# Patient Record
Sex: Female | Born: 1960 | Race: White | Hispanic: No | Marital: Married | Smoking: Former smoker
Health system: Southern US, Community
[De-identification: ages and names within clinical notes are randomized; demographics above are authoritative.]

## PROBLEM LIST (undated history)

## (undated) DIAGNOSIS — H539 Unspecified visual disturbance: Secondary | ICD-10-CM

## (undated) DIAGNOSIS — K862 Cyst of pancreas: Secondary | ICD-10-CM

## (undated) DIAGNOSIS — H04123 Dry eye syndrome of bilateral lacrimal glands: Secondary | ICD-10-CM

## (undated) DIAGNOSIS — G43909 Migraine, unspecified, not intractable, without status migrainosus: Secondary | ICD-10-CM

## (undated) DIAGNOSIS — K760 Fatty (change of) liver, not elsewhere classified: Secondary | ICD-10-CM

## (undated) DIAGNOSIS — R011 Cardiac murmur, unspecified: Secondary | ICD-10-CM

## (undated) DIAGNOSIS — E7800 Pure hypercholesterolemia, unspecified: Secondary | ICD-10-CM

## (undated) DIAGNOSIS — R112 Nausea with vomiting, unspecified: Secondary | ICD-10-CM

## (undated) DIAGNOSIS — T7840XA Allergy, unspecified, initial encounter: Secondary | ICD-10-CM

## (undated) DIAGNOSIS — I10 Essential (primary) hypertension: Secondary | ICD-10-CM

## (undated) DIAGNOSIS — R202 Paresthesia of skin: Secondary | ICD-10-CM

## (undated) DIAGNOSIS — Z9889 Other specified postprocedural states: Secondary | ICD-10-CM

## (undated) HISTORY — PX: TONSILLECTOMY: SUR1361

## (undated) HISTORY — DX: Cyst of pancreas: K86.2

## (undated) HISTORY — DX: Other specified postprocedural states: Z98.890

## (undated) HISTORY — DX: Fatty (change of) liver, not elsewhere classified: K76.0

## (undated) HISTORY — DX: Essential (primary) hypertension: I10

## (undated) HISTORY — PX: ABDOMINAL HYSTERECTOMY: SUR658

## (undated) HISTORY — DX: Migraine, unspecified, not intractable, without status migrainosus: G43.909

## (undated) HISTORY — DX: Cardiac murmur, unspecified: R01.1

## (undated) HISTORY — DX: Dry eye syndrome of bilateral lacrimal glands: H04.123

## (undated) HISTORY — DX: Allergy, unspecified, initial encounter: T78.40XA

## (undated) HISTORY — DX: Paresthesia of skin: R20.2

## (undated) HISTORY — PX: POLYPECTOMY: SHX149

## (undated) HISTORY — DX: Nausea with vomiting, unspecified: R11.2

## (undated) HISTORY — DX: Pure hypercholesterolemia, unspecified: E78.00

## (undated) HISTORY — DX: Unspecified visual disturbance: H53.9

---

## 1998-06-01 ENCOUNTER — Other Ambulatory Visit: Admission: RE | Admit: 1998-06-01 | Discharge: 1998-06-01 | Payer: Self-pay | Admitting: Obstetrics and Gynecology

## 1999-06-20 ENCOUNTER — Other Ambulatory Visit: Admission: RE | Admit: 1999-06-20 | Discharge: 1999-06-20 | Payer: Self-pay | Admitting: Obstetrics and Gynecology

## 2000-06-25 ENCOUNTER — Other Ambulatory Visit: Admission: RE | Admit: 2000-06-25 | Discharge: 2000-06-25 | Payer: Self-pay | Admitting: Obstetrics and Gynecology

## 2001-06-07 ENCOUNTER — Other Ambulatory Visit: Admission: RE | Admit: 2001-06-07 | Discharge: 2001-06-07 | Payer: Self-pay | Admitting: Obstetrics and Gynecology

## 2001-06-07 ENCOUNTER — Encounter (INDEPENDENT_AMBULATORY_CARE_PROVIDER_SITE_OTHER): Payer: Self-pay

## 2001-06-26 ENCOUNTER — Other Ambulatory Visit: Admission: RE | Admit: 2001-06-26 | Discharge: 2001-06-26 | Payer: Self-pay | Admitting: Obstetrics and Gynecology

## 2002-08-11 ENCOUNTER — Other Ambulatory Visit: Admission: RE | Admit: 2002-08-11 | Discharge: 2002-08-11 | Payer: Self-pay | Admitting: Obstetrics and Gynecology

## 2003-08-14 ENCOUNTER — Other Ambulatory Visit: Admission: RE | Admit: 2003-08-14 | Discharge: 2003-08-14 | Payer: Self-pay | Admitting: Obstetrics and Gynecology

## 2004-09-01 ENCOUNTER — Other Ambulatory Visit: Admission: RE | Admit: 2004-09-01 | Discharge: 2004-09-01 | Payer: Self-pay | Admitting: Obstetrics and Gynecology

## 2005-09-14 ENCOUNTER — Other Ambulatory Visit: Admission: RE | Admit: 2005-09-14 | Discharge: 2005-09-14 | Payer: Self-pay | Admitting: Obstetrics and Gynecology

## 2006-11-19 ENCOUNTER — Other Ambulatory Visit: Admission: RE | Admit: 2006-11-19 | Discharge: 2006-11-19 | Payer: Self-pay | Admitting: Obstetrics and Gynecology

## 2007-12-20 ENCOUNTER — Other Ambulatory Visit: Admission: RE | Admit: 2007-12-20 | Discharge: 2007-12-20 | Payer: Self-pay | Admitting: Family Medicine

## 2009-12-18 HISTORY — PX: ABDOMINAL HYSTERECTOMY: SUR658

## 2011-05-24 ENCOUNTER — Emergency Department (HOSPITAL_COMMUNITY): Payer: PRIVATE HEALTH INSURANCE

## 2011-05-24 ENCOUNTER — Observation Stay (HOSPITAL_COMMUNITY)
Admission: EM | Admit: 2011-05-24 | Discharge: 2011-05-24 | Disposition: A | Discharge status: Home / Self Care | Payer: PRIVATE HEALTH INSURANCE | Attending: Emergency Medicine | Admitting: Emergency Medicine

## 2011-05-24 ENCOUNTER — Observation Stay (HOSPITAL_COMMUNITY): Payer: PRIVATE HEALTH INSURANCE

## 2011-05-24 DIAGNOSIS — R079 Chest pain, unspecified: Principal | ICD-10-CM | POA: Insufficient documentation

## 2011-05-24 DIAGNOSIS — I10 Essential (primary) hypertension: Secondary | ICD-10-CM | POA: Insufficient documentation

## 2011-05-24 LAB — TROPONIN I
Troponin I: <0.3 ng/mL (ref <0.30)
Troponin I: <0.3 ng/mL (ref <0.30)

## 2011-05-24 LAB — CK TOTAL AND CKMB (NOT AT ARMC)
CK, MB: 1.7 ng/mL (ref 0.3–4.0)
Relative Index: INVALID (ref 0.0–2.5)
Total CK: 72 U/L (ref 7–177)

## 2011-05-24 LAB — BASIC METABOLIC PANEL
BUN: 17 mg/dL (ref 6–23)
Chloride: 99 mEq/L (ref 96–112)
Potassium: 3.7 mEq/L (ref 3.5–5.1)

## 2011-05-24 LAB — CBC
Platelets: 281 10*3/uL (ref 150–400)
RBC: 4.02 MIL/uL (ref 3.87–5.11)
RDW: 12 % (ref 11.5–15.5)
WBC: 4.7 10*3/uL (ref 4.0–10.5)

## 2011-05-24 LAB — DIFFERENTIAL
Basophils Absolute: 0 10*3/uL (ref 0.0–0.1)
Basophils Relative: 0 % (ref 0–1)
Eosinophils Absolute: 0.2 10*3/uL (ref 0.0–0.7)
Eosinophils Relative: 4 % (ref 0–5)
Neutrophils Relative %: 55 % (ref 43–77)

## 2011-05-24 MED ORDER — IOHEXOL 350 MG/ML SOLN
50.0000 mL | Freq: Once | INTRAVENOUS | Status: AC | PRN
  Administered 2011-05-24: 50 mL via INTRAVENOUS

## 2016-08-14 HISTORY — PX: COLONOSCOPY: SHX174

## 2016-11-29 ENCOUNTER — Other Ambulatory Visit: Payer: Self-pay | Admitting: Physician Assistant

## 2016-11-29 DIAGNOSIS — R748 Abnormal levels of other serum enzymes: Secondary | ICD-10-CM

## 2016-12-08 ENCOUNTER — Ambulatory Visit
Admission: RE | Admit: 2016-12-08 | Discharge: 2016-12-08 | Disposition: A | Discharge status: Home / Self Care | Payer: PRIVATE HEALTH INSURANCE | Source: Ambulatory Visit | Attending: Physician Assistant | Admitting: Physician Assistant

## 2016-12-08 DIAGNOSIS — R748 Abnormal levels of other serum enzymes: Secondary | ICD-10-CM

## 2016-12-28 ENCOUNTER — Other Ambulatory Visit: Payer: Self-pay | Admitting: Physician Assistant

## 2016-12-28 DIAGNOSIS — R932 Abnormal findings on diagnostic imaging of liver and biliary tract: Secondary | ICD-10-CM

## 2016-12-29 ENCOUNTER — Ambulatory Visit
Admission: RE | Admit: 2016-12-29 | Discharge: 2016-12-29 | Disposition: A | Discharge status: Home / Self Care | Payer: Self-pay | Source: Ambulatory Visit | Attending: Physician Assistant | Admitting: Physician Assistant

## 2016-12-29 DIAGNOSIS — R932 Abnormal findings on diagnostic imaging of liver and biliary tract: Secondary | ICD-10-CM

## 2016-12-29 MED ORDER — GADOBENATE DIMEGLUMINE 529 MG/ML IV SOLN
16.0000 mL | Freq: Once | INTRAVENOUS | Status: AC | PRN
  Administered 2016-12-29: 16 mL via INTRAVENOUS

## 2017-12-05 ENCOUNTER — Other Ambulatory Visit: Payer: Self-pay | Admitting: Physician Assistant

## 2017-12-05 DIAGNOSIS — K863 Pseudocyst of pancreas: Principal | ICD-10-CM

## 2017-12-05 DIAGNOSIS — K862 Cyst of pancreas: Secondary | ICD-10-CM

## 2017-12-31 ENCOUNTER — Ambulatory Visit
Admission: RE | Admit: 2017-12-31 | Discharge: 2017-12-31 | Disposition: A | Discharge status: Home / Self Care | Payer: BLUE CROSS/BLUE SHIELD | Source: Ambulatory Visit | Attending: Physician Assistant | Admitting: Physician Assistant

## 2017-12-31 DIAGNOSIS — K862 Cyst of pancreas: Secondary | ICD-10-CM

## 2017-12-31 DIAGNOSIS — K863 Pseudocyst of pancreas: Principal | ICD-10-CM

## 2017-12-31 MED ORDER — IOPAMIDOL (ISOVUE-300) INJECTION 61%
100.0000 mL | Freq: Once | INTRAVENOUS | Status: AC | PRN
  Administered 2017-12-31: 125 mL via INTRAVENOUS

## 2018-12-27 DIAGNOSIS — G43009 Migraine without aura, not intractable, without status migrainosus: Secondary | ICD-10-CM | POA: Diagnosis not present

## 2018-12-27 DIAGNOSIS — E7800 Pure hypercholesterolemia, unspecified: Secondary | ICD-10-CM | POA: Diagnosis not present

## 2018-12-27 DIAGNOSIS — I10 Essential (primary) hypertension: Secondary | ICD-10-CM | POA: Diagnosis not present

## 2019-02-04 ENCOUNTER — Other Ambulatory Visit: Payer: Self-pay | Admitting: Physician Assistant

## 2019-02-04 ENCOUNTER — Other Ambulatory Visit (HOSPITAL_COMMUNITY): Payer: Self-pay | Admitting: Physician Assistant

## 2019-02-04 DIAGNOSIS — I358 Other nonrheumatic aortic valve disorders: Secondary | ICD-10-CM

## 2019-02-11 ENCOUNTER — Encounter (INDEPENDENT_AMBULATORY_CARE_PROVIDER_SITE_OTHER): Payer: Self-pay

## 2019-02-11 ENCOUNTER — Ambulatory Visit (HOSPITAL_COMMUNITY): Payer: Commercial Managed Care - PPO | Attending: Cardiovascular Disease

## 2019-02-11 DIAGNOSIS — I358 Other nonrheumatic aortic valve disorders: Secondary | ICD-10-CM | POA: Insufficient documentation

## 2019-02-24 ENCOUNTER — Encounter: Payer: Self-pay | Admitting: *Deleted

## 2019-02-25 ENCOUNTER — Encounter: Payer: Self-pay | Admitting: Neurology

## 2019-02-25 ENCOUNTER — Ambulatory Visit: Payer: Commercial Managed Care - PPO | Admitting: Neurology

## 2019-02-25 ENCOUNTER — Other Ambulatory Visit: Payer: Self-pay

## 2019-02-25 VITALS — BP 152/84 | HR 88 | Resp 16 | Ht 68.0 in | Wt 188.0 lb

## 2019-02-25 DIAGNOSIS — B0229 Other postherpetic nervous system involvement: Secondary | ICD-10-CM | POA: Insufficient documentation

## 2019-02-25 MED ORDER — DICLOFENAC SODIUM 1 % TD GEL: 4.0000 g | Freq: Four times a day (QID) | TRANSDERMAL | 6 refills | Status: AC

## 2019-02-25 MED ORDER — GABAPENTIN 300 MG PO CAPS: 600.0000 mg | ORAL_CAPSULE | Freq: Three times a day (TID) | ORAL | 6 refills | Status: DC

## 2019-02-25 MED ORDER — LIDOCAINE-PRILOCAINE 2.5-2.5 % EX CREA: 1.0000 "application " | TOPICAL_CREAM | CUTANEOUS | 3 refills | Status: AC | PRN

## 2019-02-25 NOTE — Progress Notes (Signed)
PATIENT: Kristen Odonnell DOB: October 23, 1961  Chief Complaint  Patient presents with  . Numbness    New room. Here alone for eval of painful numbness, tingling just below left breast. Thought sx. represented shingles, so she was treated with Valtrex. Never had a rash. Sx. have been the same since onset. No better, no worse./fim  . PCP    Cyndi Bender, PA-C     HISTORICAL  Kristen Odonnell is a 58 year old female, seen in request by her primary care PA Cyndi Bender, for evaluation of paresthesia, initial evaluation was on February 25, 2019.  I have reviewed and summarized the referring note from the referring physician.  She had a past medical history of hypertension, hyperlipidemia, migraine headaches, reported a history of chickenpox as a child.   On December 31, 2018, she noticed sensitivity below left breast, numbness, painful to touch, gradually progress, to the point of intermittent radiating, stabbing pain, there was no rash broke out, she was seen by primary care physician, was diagnosed with possible shingles, eventually was treated with Valtrex 4 weeks after symptom onset,  She was also treated with gabapentin 300 mg, initially 3 tablets a day without helping her symptoms, dosage was increased to 600 mg 3 times a day since March 2020, which has been helpful.  REVIEW OF SYSTEMS: Full 14 system review of systems performed and notable only for as above All other review of systems were negative.  ALLERGIES: No Known Allergies  HOME MEDICATIONS: Current Outpatient Medications  Medication Sig Dispense Refill  . amitriptyline (ELAVIL) 25 MG tablet TAKE 1/2 TO 1 TABLET BY MOUTH EVERY DAY AT BEDTIME    . atorvastatin (LIPITOR) 40 MG tablet Take 40 mg by mouth daily.    Marland Kitchen estradiol (VIVELLE-DOT) 0.0375 MG/24HR APPLY 1 PATCH TWICE A WEEK    . gabapentin (NEURONTIN) 300 MG capsule TAKE 1 CAPSULE BY MOUTH 3 TIMES A DAY FOR NERVE PAIN    . lisinopril-hydrochlorothiazide (PRINZIDE,ZESTORETIC)  20-25 MG tablet TAKE 1 TABLET BY MOUTH EVERY DAY FOR BLOOD PRESSURE    . XIIDRA 5 % SOLN INSTILL 1 DROP INTO EACH EYE TWICE A DAY     No current facility-administered medications for this visit.     PAST MEDICAL HISTORY: Past Medical History:  Diagnosis Date  . Fatty liver   . Heart murmur   . Hypercholesteremia   . Hypertension   . Migraine   . Pancreatic cyst   . Paresthesia   . Vision abnormalities     PAST SURGICAL HISTORY: Past Surgical History:  Procedure Laterality Date  . ABDOMINAL HYSTERECTOMY    . TONSILLECTOMY      FAMILY HISTORY: Family History  Problem Relation Age of Onset  . Hypertension Mother   . Hypercholesterolemia Mother   . Hypercholesterolemia Father   . Colon cancer Father   . Breast cancer Sister   . Von Hippel-Lindau syndrome Brother   . Other Maternal Grandmother        MVA, subdural hematoma  . Heart attack Maternal Grandfather   . Dementia Paternal Grandmother   . Lung cancer Paternal Grandfather   . Heart murmur Sister   . Arrhythmia Brother   . Hypothyroidism Brother     SOCIAL HISTORY: Social History   Socioeconomic History  . Marital status: Married    Spouse name: [name]  . Number of children: 1  . Years of education: Not on file  . Highest education level: High school graduate  Occupational History  . Occupation:  clerical work/accounting    Comment: [employer]  Social Needs  . Financial resource strain: Not on file  . Food insecurity:    Worry: Not on file    Inability: Not on file  . Transportation needs:    Medical: Not on file    Non-medical: Not on file  Tobacco Use  . Smoking status: Former Research scientist (life sciences)  . Smokeless tobacco: Never Used  Substance and Sexual Activity  . Alcohol use: Yes    Comment: Egner use  . Drug use: Never  . Sexual activity: Not on file  Lifestyle  . Physical activity:    Days per week: Not on file    Minutes per session: Not on file  . Stress: Not on file  Relationships  . Social  connections:    Talks on phone: Not on file    Gets together: Not on file    Attends religious service: Not on file    Active member of club or organization: Not on file    Attends meetings of clubs or organizations: Not on file    Relationship status: Not on file  . Intimate partner violence:    Fear of current or ex partner: Not on file    Emotionally abused: Not on file    Physically abused: Not on file    Forced sexual activity: Not on file  Other Topics Concern  . Not on file  Social History Narrative   Lives with husband.   Right-handed.   Caffeine use: 1 cup of coffee daily     PHYSICAL EXAM   Vitals:   02/25/19 1115  BP: (!) 152/84  Pulse: 88  Resp: 16  Weight: 188 lb (85.3 kg)  Height: 5\' 8"  (1.727 m)    Not recorded      Body mass index is 28.59 kg/m.  PHYSICAL EXAMNIATION:  Gen: NAD, conversant, well nourised, obese, well groomed                     Cardiovascular: Regular rate rhythm, no peripheral edema, warm, nontender. Eyes: Conjunctivae clear without exudates or hemorrhage Neck: Supple, no carotid bruits. Pulmonary: Clear to auscultation bilaterally   NEUROLOGICAL EXAM:  MENTAL STATUS: Speech:    Speech is normal; fluent and spontaneous with normal comprehension.  Cognition:     Orientation to time, place and person     Normal recent and remote memory     Normal Attention span and concentration     Normal Language, naming, repeating,spontaneous speech     Fund of knowledge   CRANIAL NERVES: CN II: Visual fields are full to confrontation. Fundoscopic exam is normal with sharp discs and no vascular changes. Pupils are round equal and briskly reactive to [place]. CN III, IV, VI: extraocular movement are normal. No ptosis. CN V: Facial sensation is intact to pinprick in all 3 divisions bilaterally. Corneal responses are intact.  CN VII: Face is symmetric with normal eye closure and smile. CN VIII: Hearing is normal to rubbing fingers CN IX, X:  Palate elevates symmetrically. Phonation is normal. CN XI: Head turning and shoulder shrug are intact CN XII: Tongue is midline with normal movements and no atrophy.  MOTOR: There is no pronator drift of out-stretched arms. Muscle bulk and tone are normal. Muscle strength is normal.  REFLEXES: Reflexes are 2+ and symmetric at the biceps, triceps, knees, and ankles. Plantar responses are flexor.  SENSORY: Allodynia of left T7-8 level, mild dry rash to mid  axillary line, no vesicles noted  COORDINATION: Rapid alternating movements and fine finger movements are intact. There is no dysmetria on finger-to-nose and heel-knee-shin.    GAIT/STANCE: Posture is normal. Gait is steady with normal steps, base, arm swing, and turning. Heel and toe walking are normal. Tandem gait is normal.  Romberg is absent.   DIAGNOSTIC DATA (LABS, IMAGING, TESTING) - I reviewed patient records, labs, notes, testing and imaging myself where available.   ASSESSMENT AND PLAN  Zosia Lucchese is a 58 y.o. female   Left thoracic postherpetic neuralgia  History most consistent with left thoracic shingles,  Continue gabapentin as symptoms control,  Also add on lidocaine plus diclofenac gel as needed    Marcial Pacas, M.D. Ph.D.  [facility] [address], [place] [address] Ph: [phone] Fax: [phone]  CC: Cyndi Bender, PA-C

## 2019-03-19 ENCOUNTER — Other Ambulatory Visit: Payer: Self-pay | Admitting: Neurology

## 2019-04-18 ENCOUNTER — Encounter

## 2019-04-18 ENCOUNTER — Ambulatory Visit: Payer: Commercial Managed Care - PPO | Admitting: Diagnostic Neuroimaging

## 2019-08-18 ENCOUNTER — Encounter: Payer: Self-pay | Admitting: Gastroenterology

## 2019-09-16 ENCOUNTER — Other Ambulatory Visit: Payer: Self-pay

## 2019-09-16 ENCOUNTER — Ambulatory Visit (AMBULATORY_SURGERY_CENTER): Payer: Self-pay | Admitting: *Deleted

## 2019-09-16 ENCOUNTER — Encounter: Payer: Self-pay | Admitting: Gastroenterology

## 2019-09-16 VITALS — Temp 97.3°F | Ht 68.0 in | Wt 182.0 lb

## 2019-09-16 DIAGNOSIS — Z860100 Personal history of colon polyps, unspecified: Secondary | ICD-10-CM

## 2019-09-16 DIAGNOSIS — Z800 Family history of malignant neoplasm of digestive organs: Secondary | ICD-10-CM

## 2019-09-16 MED ORDER — SUPREP BOWEL PREP KIT 17.5-3.13-1.6 GM/177ML PO SOLN: 1.0000 | Freq: Once | ORAL | 0 refills | Status: AC

## 2019-09-16 NOTE — Progress Notes (Signed)

## 2019-09-29 ENCOUNTER — Telehealth: Payer: Self-pay

## 2019-09-29 NOTE — Telephone Encounter (Signed)
Patient called back answered "no" to all questions.

## 2019-09-29 NOTE — Telephone Encounter (Signed)
Covid-19 screening questions   Do you now or have you had a fever in the last 14 days?  Do you have any respiratory symptoms of shortness of breath or cough now or in the last 14 days?  Do you have any family members or close contacts with diagnosed or suspected Covid-19 in the past 14 days?  Have you been tested for Covid-19 and found to be positive?       

## 2019-09-30 ENCOUNTER — Other Ambulatory Visit: Payer: Self-pay

## 2019-09-30 ENCOUNTER — Ambulatory Visit (AMBULATORY_SURGERY_CENTER): Payer: Commercial Managed Care - PPO | Admitting: Gastroenterology

## 2019-09-30 ENCOUNTER — Encounter: Payer: Self-pay | Admitting: Gastroenterology

## 2019-09-30 VITALS — BP 121/73 | HR 79 | Temp 98.4°F | Resp 15 | Ht 68.0 in | Wt 182.0 lb

## 2019-09-30 DIAGNOSIS — K635 Polyp of colon: Secondary | ICD-10-CM | POA: Diagnosis not present

## 2019-09-30 DIAGNOSIS — Z800 Family history of malignant neoplasm of digestive organs: Secondary | ICD-10-CM

## 2019-09-30 DIAGNOSIS — Z86010 Personal history of colonic polyps: Secondary | ICD-10-CM | POA: Diagnosis present

## 2019-09-30 DIAGNOSIS — D124 Benign neoplasm of descending colon: Secondary | ICD-10-CM

## 2019-09-30 DIAGNOSIS — D123 Benign neoplasm of transverse colon: Secondary | ICD-10-CM

## 2019-09-30 HISTORY — PX: COLONOSCOPY: SHX5424

## 2019-09-30 MED ORDER — SODIUM CHLORIDE 0.9 % IV SOLN: 500.0000 mL | Freq: Once | INTRAVENOUS | Status: DC

## 2019-09-30 NOTE — Op Note (Signed)
[facility] Patient Name: Kristen Odonnell Procedure Date: 09/30/2019 8:59 AM MRN: [id number] Endoscopist: Jackquline Denmark , MD Age: 58 Referring MD:  Date of Birth: 01-17-1961 Gender: Female Account #: [id number] Procedure:                Colonoscopy Indications:              Screening in patient at increased risk: FH of colon                            cancer (dad at age 75) high risk colon cancer                            surveillance: Personal history of colonic polyps Medicines:                Monitored Anesthesia Care Procedure:                Pre-Anesthesia Assessment:                           - Prior to the procedure, a History and Physical                            was performed, and patient medications and                            allergies were reviewed. The patient's tolerance of                            previous anesthesia was also reviewed. The risks                            and benefits of the procedure and the sedation                            options and risks were discussed with the patient.                            All questions were answered, and informed consent                            was obtained. Prior Anticoagulants: The patient has                            taken no previous anticoagulant or antiplatelet                            agents. ASA Grade Assessment: II - A patient with                            mild systemic disease. After reviewing the risks                            and benefits, the patient was deemed in  satisfactory condition to undergo the procedure.                           After obtaining informed consent, the colonoscope                            was passed under direct vision. Throughout the                            procedure, the patient's blood pressure, pulse, and                            oxygen saturations were monitored continuously. The                            Colonoscope was  introduced through the anus and                            advanced to the the cecum, identified by                            appendiceal orifice and ileocecal valve. Thereafter                            2 cm of TI was intubated. The colonoscopy was                            performed without difficulty. The patient tolerated                            the procedure well. The quality of the bowel                            preparation was good. The terminal ileum, ileocecal                            valve, appendiceal orifice, and rectum were                            photographed. Scope In: 9:04:01 AM Scope Out: 9:20:46 AM Scope Withdrawal Time: 0 hours 10 minutes 41 seconds  Total Procedure Duration: 0 hours 16 minutes 45 seconds  Findings:                 A 10 mm polyp was found in the proximal ascending                            colon. The polyp was sessile. The polyp was removed                            with a hot snare. Resection and retrieval were                            complete.  Two sessile polyps were found in the descending                            colon and mid transverse colon. The polyps were 4                            to 6 mm in size. These polyps were removed with a                            cold snare. Resection and retrieval were complete.                           Non-bleeding internal hemorrhoids were found during                            retroflexion. The hemorrhoids were small.                           The exam was otherwise without abnormality. Complications:            No immediate complications. Estimated Blood Loss:     Estimated blood loss: none. Impression:               - Colonic polyps s/p polypectomy                           - Non-bleeding internal hemorrhoids.                           - The examination was otherwise normal. Recommendation:           - Patient has a contact number available for                             emergencies. The signs and symptoms of potential                            delayed complications were discussed with the                            patient. Return to normal activities tomorrow.                            Written discharge instructions were provided to the                            patient.                           - Resume previous diet.                           - Continue present medications.                           - Await pathology results.                           -  Repeat colonoscopy for surveillance based on                            pathology results.                           - Return to GI clinic PRN. Jackquline Denmark, MD 09/30/2019 9:26:51 AM This report has been signed electronically.

## 2019-09-30 NOTE — Progress Notes (Signed)
Report given to PACU, vss 

## 2019-09-30 NOTE — Patient Instructions (Signed)
Handouts given for hemorrhoids and polyps.  YOU HAD AN ENDOSCOPIC PROCEDURE TODAY AT THE [facility]:   Refer to the procedure report that was given to you for any specific questions about what was found during the examination.  If the procedure report does not answer your questions, please call your gastroenterologist to clarify.  If you requested that your care partner not be given the details of your procedure findings, then the procedure report has been included in a sealed envelope for you to review at your convenience later.  YOU SHOULD EXPECT: Some feelings of bloating in the abdomen. Passage of more gas than usual.  Walking can help get rid of the air that was put into your GI tract during the procedure and reduce the bloating. If you had a lower endoscopy (such as a colonoscopy or flexible sigmoidoscopy) you may notice spotting of blood in your stool or on the toilet paper. If you underwent a bowel prep for your procedure, you may not have a normal bowel movement for a few days.  Please Note:  You might notice some irritation and congestion in your nose or some drainage.  This is from the oxygen used during your procedure.  There is no need for concern and it should clear up in a day or so.  SYMPTOMS TO REPORT IMMEDIATELY:   Following lower endoscopy (colonoscopy or flexible sigmoidoscopy):  Excessive amounts of blood in the stool  Significant tenderness or worsening of abdominal pains  Swelling of the abdomen that is new, acute  Fever of 100F or higher   For urgent or emergent issues, a gastroenterologist can be reached at any hour by calling [phone].   DIET:  We do recommend a small meal at first, but then you may proceed to your regular diet.  Drink plenty of fluids but you should avoid alcoholic beverages for 24 hours.  ACTIVITY:  You should plan to take it easy for the rest of today and you should NOT DRIVE or use heavy machinery until tomorrow (because of  the sedation medicines used during the test).    FOLLOW UP: Our staff will call the number listed on your records 48-72 hours following your procedure to check on you and address any questions or concerns that you may have regarding the information given to you following your procedure. If we do not reach you, we will leave a message.  We will attempt to reach you two times.  During this call, we will ask if you have developed any symptoms of COVID 19. If you develop any symptoms (ie: fever, flu-like symptoms, shortness of breath, cough etc.) before then, please call [phone].  If you test positive for Covid 19 in the 2 weeks post procedure, please call and report this information to us.    If any biopsies were taken you will be contacted by phone or by letter within the next 1-3 weeks.  Please call us at [phone] if you have not heard about the biopsies in 3 weeks.    SIGNATURES/CONFIDENTIALITY: You and/or your care partner have signed paperwork which will be entered into your electronic medical record.  These signatures attest to the fact that that the information above on your After Visit Summary has been reviewed and is understood.  Full responsibility of the confidentiality of this discharge information lies with you and/or your care-partner. 

## 2019-09-30 NOTE — Progress Notes (Signed)
Called to room to assist during endoscopic procedure.  Patient ID and intended procedure confirmed with present staff. Received instructions for my participation in the procedure from the performing physician.  

## 2019-09-30 NOTE — Progress Notes (Signed)
Temperature- Kristen Odonnell  Pt was tested for COVID 19 6 weeks ago- per pt results were negative  Pt's states no medical or surgical changes since previsit or office visit.

## 2019-10-02 ENCOUNTER — Telehealth: Payer: Self-pay

## 2019-10-02 NOTE — Telephone Encounter (Signed)
  Follow up Call-  Call back number 09/30/2019  Post procedure Call Back phone  # [phone]  Permission to leave phone message Yes  Some recent data might be hidden     Patient questions:  Do you have a fever, pain , or abdominal swelling? No. Pain Score  0 *  Have you tolerated food without any problems? Yes.    Have you been able to return to your normal activities? Yes.    Do you have any questions about your discharge instructions: Diet   No. Medications  No. Follow up visit  No.  Do you have questions or concerns about your Care? No.  Actions: * If pain score is 4 or above: 1. No action needed, pain <4.Have you developed a fever since your procedure? no  2.   Have you had an respiratory symptoms (SOB or cough) since your procedure? no  3.   Have you tested positive for COVID 19 since your procedure no  4.   Have you had any family members/close contacts diagnosed with the COVID 19 since your procedure?  no   If yes to any of these questions please route to Joylene John, RN and Alphonsa Gin, Therapist, sports.

## 2019-10-06 ENCOUNTER — Encounter: Payer: Self-pay | Admitting: Gastroenterology

## 2020-01-02 ENCOUNTER — Other Ambulatory Visit: Payer: Self-pay | Admitting: Physician Assistant

## 2020-01-02 DIAGNOSIS — K862 Cyst of pancreas: Secondary | ICD-10-CM

## 2020-01-14 ENCOUNTER — Ambulatory Visit
Admission: RE | Admit: 2020-01-14 | Discharge: 2020-01-14 | Disposition: A | Discharge status: Home / Self Care | Payer: Commercial Managed Care - PPO | Source: Ambulatory Visit | Attending: Physician Assistant | Admitting: Physician Assistant

## 2020-01-14 ENCOUNTER — Other Ambulatory Visit: Payer: Self-pay

## 2020-01-14 DIAGNOSIS — K862 Cyst of pancreas: Secondary | ICD-10-CM

## 2020-01-14 MED ORDER — IOPAMIDOL (ISOVUE-300) INJECTION 61%
100.0000 mL | Freq: Once | INTRAVENOUS | Status: AC | PRN
  Administered 2020-01-14: 100 mL via INTRAVENOUS

## 2021-01-07 ENCOUNTER — Other Ambulatory Visit: Payer: Self-pay | Admitting: Physician Assistant

## 2021-01-07 DIAGNOSIS — K862 Cyst of pancreas: Secondary | ICD-10-CM

## 2021-01-25 ENCOUNTER — Ambulatory Visit
Admission: RE | Admit: 2021-01-25 | Discharge: 2021-01-25 | Disposition: A | Discharge status: Home / Self Care | Payer: Commercial Managed Care - PPO | Source: Ambulatory Visit | Attending: Physician Assistant | Admitting: Physician Assistant

## 2021-01-25 ENCOUNTER — Other Ambulatory Visit: Payer: Self-pay

## 2021-01-25 DIAGNOSIS — K862 Cyst of pancreas: Secondary | ICD-10-CM

## 2021-01-25 MED ORDER — IOPAMIDOL (ISOVUE-300) INJECTION 61%
100.0000 mL | Freq: Once | INTRAVENOUS | Status: AC | PRN
  Administered 2021-01-25: 100 mL via INTRAVENOUS

## 2021-05-05 IMAGING — CT CT ABDOMEN WO/W CM
3 of 7 series · 14 of 32 positions shown, 19 images · IV contrast (APPLIED)
Comparison: CT 01/14/2020

CLINICAL DATA: Pancreatic cysts.  Asymptomatic

EXAM:
CT ABDOMEN WITHOUT AND WITH CONTRAST
TECHNIQUE: Multidetector CT imaging of the abdomen was performed following the
standard protocol before and following the bolus administration of
intravenous contrast.
CONTRAST:  100mL [id number] IOPAMIDOL ([id number]) INJECTION 61%

[Series 2: pancreas w/(date) · axial · 0.76mm/px · z∈[-248,-198]mm · 2 of 49 slices shown]
[im 10/49  soft-tissue]
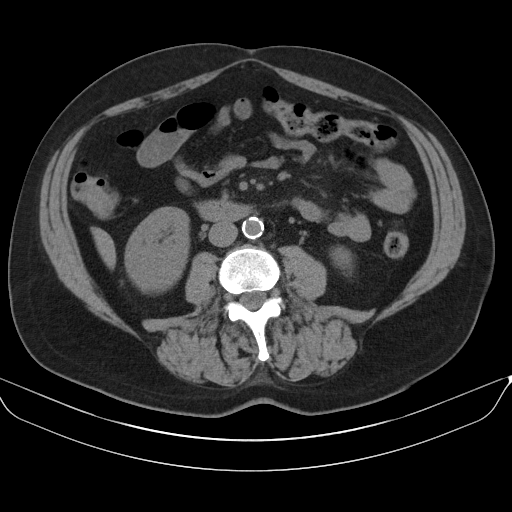
[im 20/49  soft-tissue]
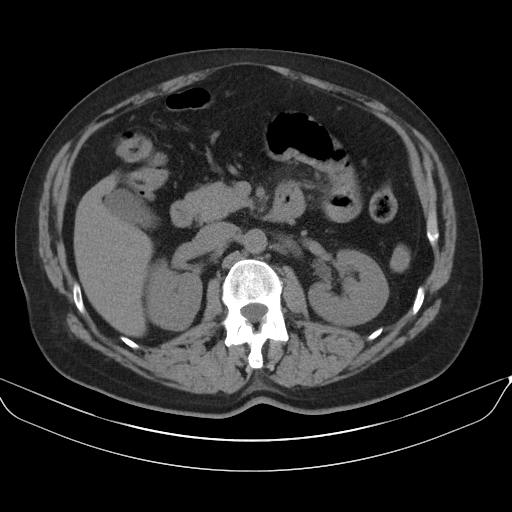

[Series 3: arterial · axial · arterial · 0.78mm/px · z∈[-266,-80]mm · 6 of 88 slices shown]
[im 13/88  soft-tissue]
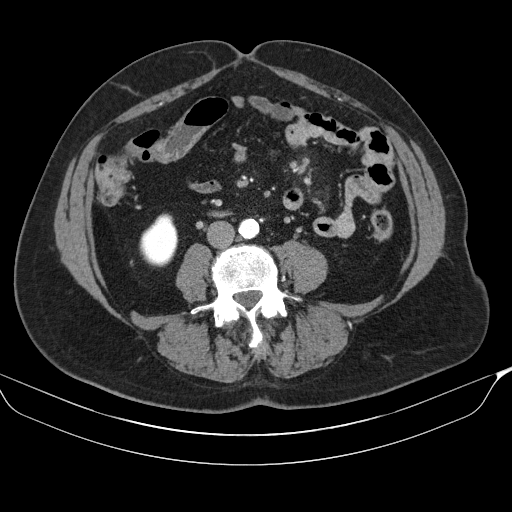
[im 25/88  soft-tissue]
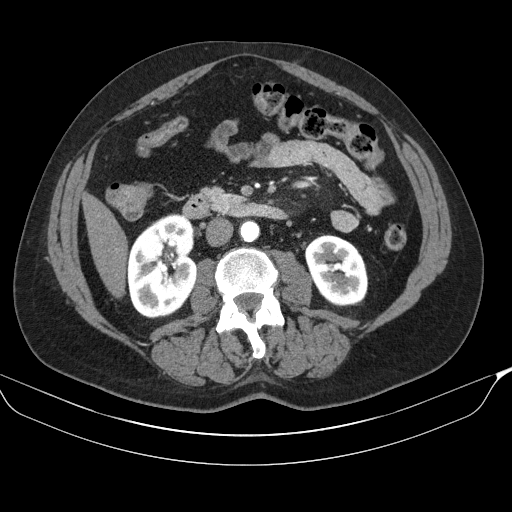
[im 38/88  soft-tissue]
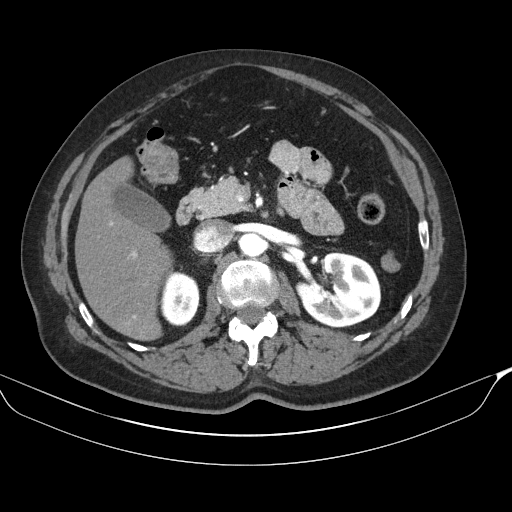
[im 50/88  soft-tissue]
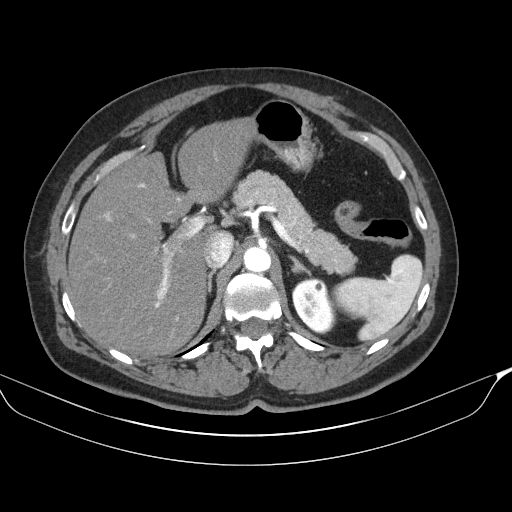
[im 63/88  soft-tissue]
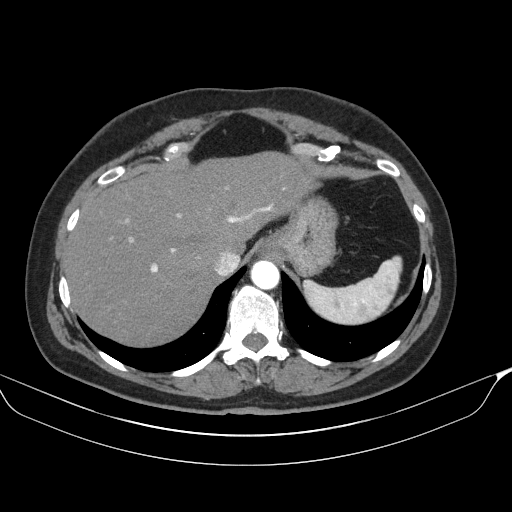
[im 75/88  soft-tissue]
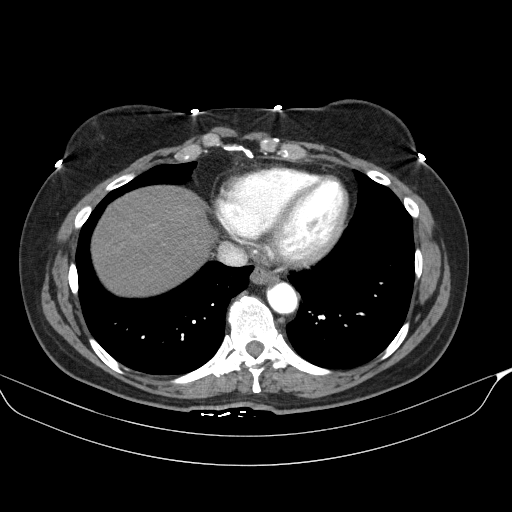

[Series 4: venous · axial · portal-venous · 0.76mm/px · z∈[-270,-82]mm · 6 of 89 slices shown, 11 images]
[im 13/89  soft-tissue]
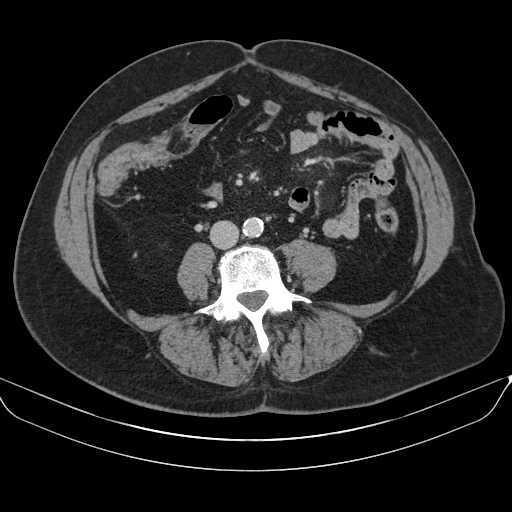
[im 13/89  bone]
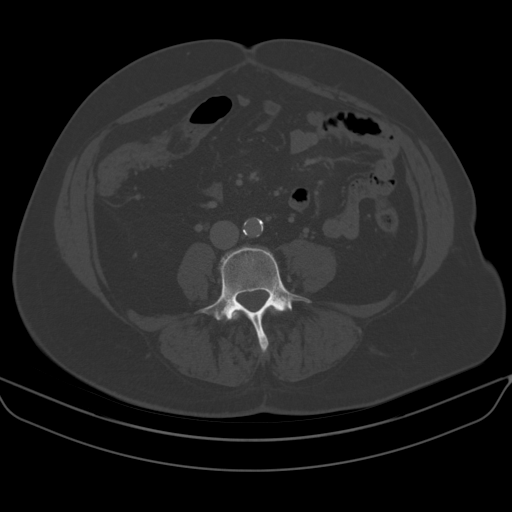
[im 26/89  soft-tissue]
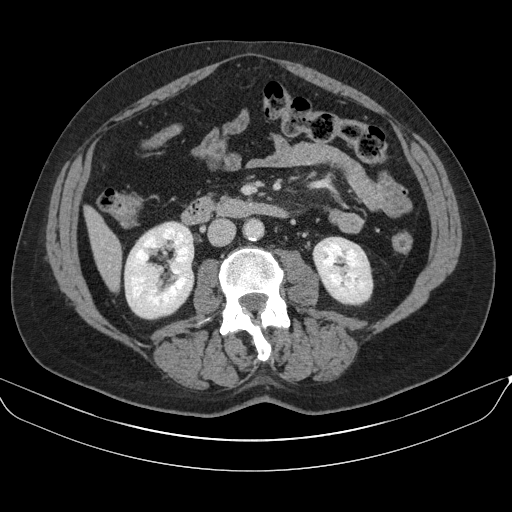
[im 38/89  soft-tissue]
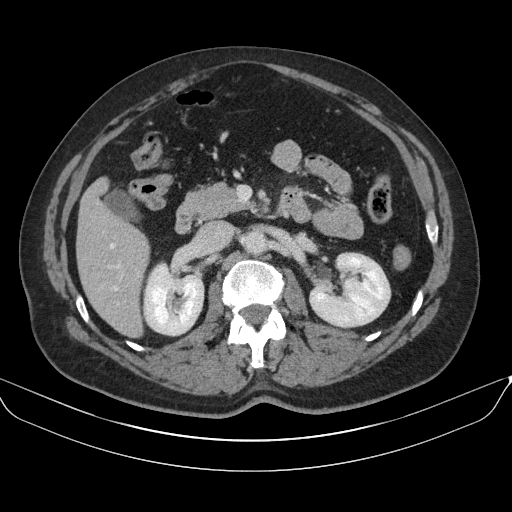
[im 38/89  lung]
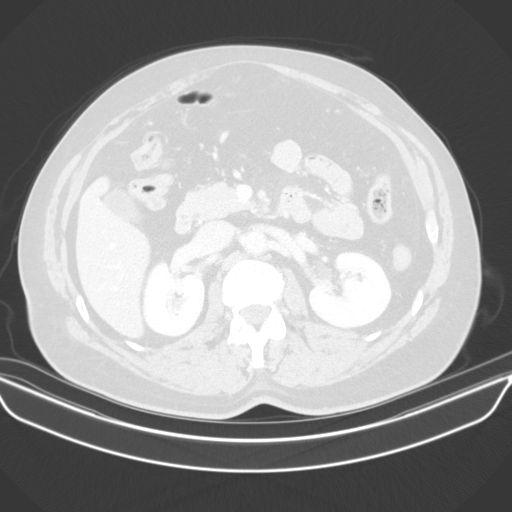
[im 51/89  soft-tissue]
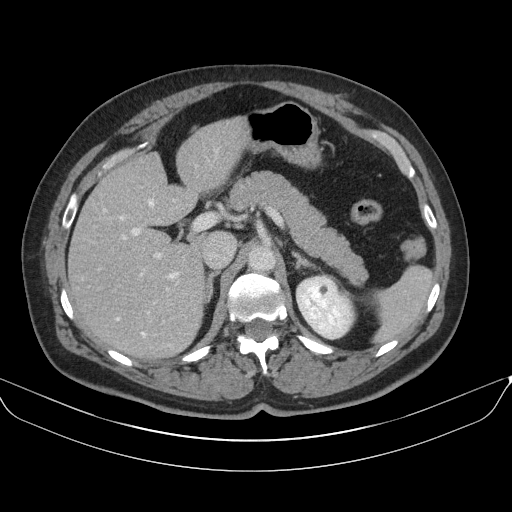
[im 51/89  lung]
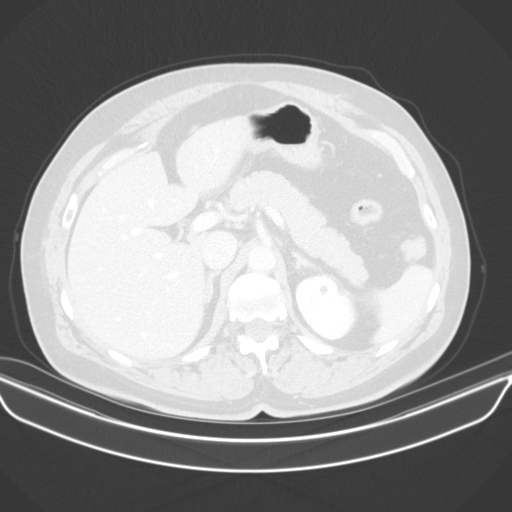
[im 63/89  soft-tissue]
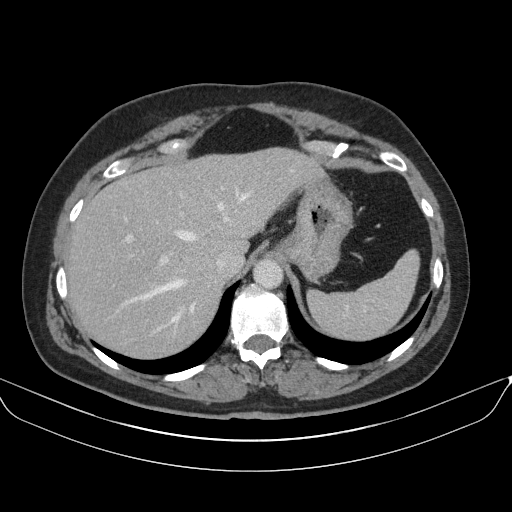
[im 63/89  lung]
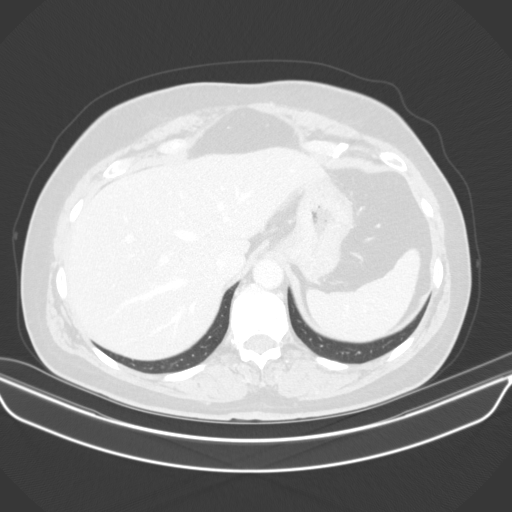
[im 76/89  soft-tissue]
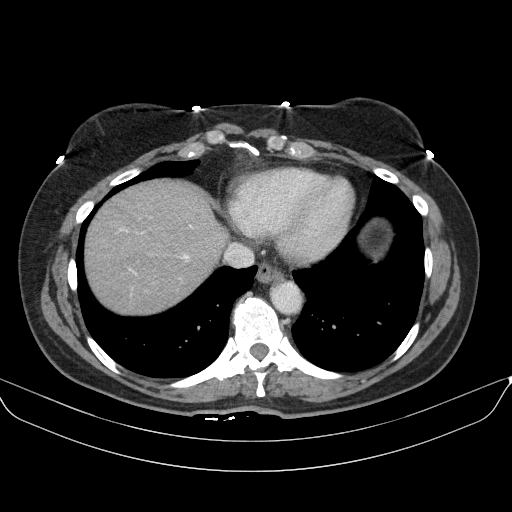
[im 76/89  lung]
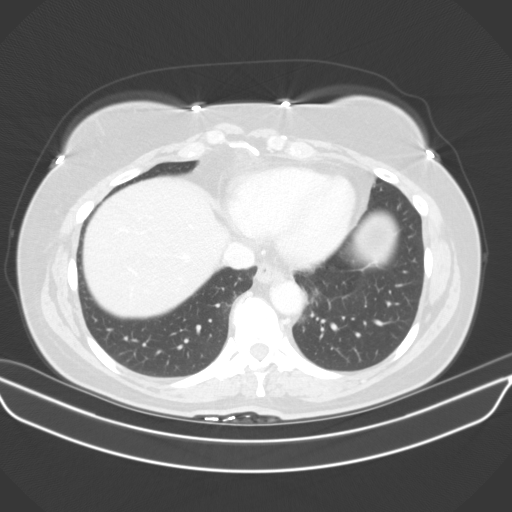

[14 of 32 positions shown; findings below may reference images not displayed]

FINDINGS: Lower chest:  Lung bases are clear.

Hepatobiliary: No focal hepatic lesion. Gallbladder normal. Common
bile duct normal.

Pancreas: Again demonstrate tiny cystic lesion head of pancreas
measuring 3 mm on image 59/3. Lesion not changed from MRI 12/29/2016
or CT 12/31/2017. No communication with the duct.

Second cystic lesion along the body the pancreas measuring 3 mm
(image 43/3) is unchanged from 1 year prior. This lesion is faintly
seen on MRI 9293 on image [DATE])

No duct dilatation.

Spleen: Normal spleen.

Adrenals/urinary tract: Adrenal glands and kidneys are normal.

Stomach/Bowel: Stomach and limited of the small bowel is
unremarkable

Vascular/Lymphatic: Abdominal aortic normal caliber. No
retroperitoneal periportal lymphadenopathy.

Musculoskeletal: No aggressive osseous lesion
IMPRESSION: 1. No change in two small benign appearing cystic lesions in the
pancreas over 4 year interval. These are considered benign cystic
lesions. This recommendation follows ACR consensus guidelines:
Management of Incidental Pancreatic Cysts: A White Paper of the ACR
Incidental Findings Committee. [HOSPITAL] 9208;[DATE].

2.  Mild hepatic steatosis.

## 2022-10-05 ENCOUNTER — Encounter: Payer: Self-pay | Admitting: Gastroenterology

## 2022-10-16 ENCOUNTER — Encounter: Payer: Self-pay | Admitting: Gastroenterology

## 2022-11-06 ENCOUNTER — Ambulatory Visit (AMBULATORY_SURGERY_CENTER): Payer: Self-pay

## 2022-11-06 VITALS — Ht 68.0 in | Wt 176.0 lb

## 2022-11-06 DIAGNOSIS — Z86010 Personal history of colonic polyps: Secondary | ICD-10-CM

## 2022-11-06 DIAGNOSIS — Z800 Family history of malignant neoplasm of digestive organs: Secondary | ICD-10-CM

## 2022-11-06 MED ORDER — NA SULFATE-K SULFATE-MG SULF 17.5-3.13-1.6 GM/177ML PO SOLN: 1.0000 | Freq: Once | ORAL | 0 refills | Status: DC

## 2022-11-06 MED ORDER — NA SULFATE-K SULFATE-MG SULF 17.5-3.13-1.6 GM/177ML PO SOLN: 1.0000 | Freq: Once | ORAL | 0 refills | Status: AC

## 2022-11-06 NOTE — Progress Notes (Signed)

## 2022-11-28 ENCOUNTER — Encounter: Payer: Self-pay | Admitting: Gastroenterology

## 2022-12-04 ENCOUNTER — Ambulatory Visit (AMBULATORY_SURGERY_CENTER): Payer: Commercial Managed Care - PPO | Admitting: Gastroenterology

## 2022-12-04 ENCOUNTER — Encounter: Payer: Self-pay | Admitting: Gastroenterology

## 2022-12-04 VITALS — BP 118/89 | HR 75 | Temp 97.5°F | Resp 16 | Ht 68.0 in | Wt 176.0 lb

## 2022-12-04 DIAGNOSIS — D125 Benign neoplasm of sigmoid colon: Secondary | ICD-10-CM

## 2022-12-04 DIAGNOSIS — Z86010 Personal history of colonic polyps: Secondary | ICD-10-CM

## 2022-12-04 DIAGNOSIS — K635 Polyp of colon: Secondary | ICD-10-CM

## 2022-12-04 DIAGNOSIS — Z800 Family history of malignant neoplasm of digestive organs: Secondary | ICD-10-CM | POA: Diagnosis not present

## 2022-12-04 DIAGNOSIS — Z09 Encounter for follow-up examination after completed treatment for conditions other than malignant neoplasm: Secondary | ICD-10-CM

## 2022-12-04 MED ORDER — SODIUM CHLORIDE 0.9 % IV SOLN: 500.0000 mL | Freq: Once | INTRAVENOUS | Status: DC

## 2022-12-04 NOTE — Progress Notes (Signed)
Report to pacu rn. Vss. Care resumed by rn. 

## 2022-12-04 NOTE — Progress Notes (Signed)
VS completed by DT.  Pt's states no medical or surgical changes since previsit or office visit.  

## 2022-12-04 NOTE — Op Note (Signed)
[facility] Patient Name: Kristen Odonnell Procedure Date: 12/04/2022 8:28 AM MRN: [id number] Endoscopist: Jackquline Denmark , MD, [id number] Age: 61 Referring MD:  Date of Birth: 03-Jul-1961 Gender: Female Account #: [id number] Procedure:                Colonoscopy Indications:              Screening in patient at increased risk: FH of colon                            cancer (dad at age 45). Personal history of colonic                            polyps Medicines:                Monitored Anesthesia Care Procedure:                Pre-Anesthesia Assessment:                           - Prior to the procedure, a History and Physical                            was performed, and patient medications and                            allergies were reviewed. The patient's tolerance of                            previous anesthesia was also reviewed. The risks                            and benefits of the procedure and the sedation                            options and risks were discussed with the patient.                            All questions were answered, and informed consent                            was obtained. Prior Anticoagulants: The patient has                            taken no anticoagulant or antiplatelet agents. ASA                            Grade Assessment: II - A patient with mild systemic                            disease. After reviewing the risks and benefits,                            the patient was deemed in satisfactory condition to  undergo the procedure.                           After obtaining informed consent, the colonoscope                            was passed under direct vision. Throughout the                            procedure, the patient's blood pressure, pulse, and                            oxygen saturations were monitored continuously. The                            Olympus PCF-H190DL (#[id number]) Colonoscope was                             introduced through the anus and advanced to the 2                            cm into the ileum. The colonoscopy was performed                            without difficulty. The patient tolerated the                            procedure well. The quality of the bowel                            preparation was good. The terminal ileum, ileocecal                            valve, appendiceal orifice, and rectum were                            photographed. Scope In: 8:34:49 AM Scope Out: 8:49:01 AM Scope Withdrawal Time: 0 hours 10 minutes 15 seconds  Total Procedure Duration: 0 hours 14 minutes 12 seconds  Findings:                 A 4 mm polyp was found in the mid sigmoid colon.                            The polyp was sessile. The polyp was removed with a                            cold snare. Resection and retrieval were complete.                           Non-bleeding internal hemorrhoids were found during                            retroflexion. The hemorrhoids were small and Grade  I (internal hemorrhoids that do not prolapse).                           The terminal ileum appeared normal.                           The exam was otherwise without abnormality on                            direct and retroflexion views. Complications:            No immediate complications. Estimated Blood Loss:     Estimated blood loss: none. Impression:               - One 4 mm polyp in the mid sigmoid colon, removed                            with a cold snare. Resected and retrieved.                           - Non-bleeding internal hemorrhoids.                           - The examined portion of the ileum was normal.                           - The examination was otherwise normal on direct                            and retroflexion views. Recommendation:           - Patient has a contact number available for                            emergencies.  The signs and symptoms of potential                            delayed complications were discussed with the                            patient. Return to normal activities tomorrow.                            Written discharge instructions were provided to the                            patient.                           - Resume previous diet.                           - Continue present medications.                           - Await pathology results.                           -  Repeat colonoscopy in 5 years for surveillance                            based on pathology results.                           - The findings and recommendations were discussed                            with Kristen Odonnell. Jackquline Denmark, MD 12/04/2022 8:54:26 AM This report has been signed electronically.

## 2022-12-04 NOTE — Progress Notes (Signed)
[facility] History and Physical   Primary Care Physician:  Cyndi Bender, PA-C   Reason for Procedure:   FH of colon cancer (dad at age 63) high risk colon cancer surveillance: Personal history of colonic polyps  Plan:    colon     HPI: Kristen Odonnell is a 61 y.o. female    Past Medical History:  Diagnosis Date   Allergy    occ    Dry eyes    Fatty liver    Heart murmur    Hypercholesteremia    Hypertension    Migraine    Pancreatic cyst    Paresthesia    PONV (postoperative nausea and vomiting)    Vision abnormalities     Past Surgical History:  Procedure Laterality Date   ABDOMINAL HYSTERECTOMY  2011   COLONOSCOPY  08/14/2016   Colonic polyps status post polypectomy. Small internal hemorrhoids.   COLONOSCOPY  09/30/2019   no polyps   POLYPECTOMY     TONSILLECTOMY      Prior to Admission medications   Medication Sig Start Date End Date Taking? Authorizing Provider  amitriptyline (ELAVIL) 25 MG tablet TAKE 1/2 TO 1 TABLET BY MOUTH EVERY DAY AT BEDTIME 11/27/18  Yes [provider]  atorvastatin (LIPITOR) 40 MG tablet Take 40 mg by mouth daily. 11/22/18  Yes [provider]  estradiol (VIVELLE-DOT) 0.0375 MG/24HR APPLY 1 PATCH TWICE A WEEK 12/10/18  Yes [provider]  lisinopril-hydrochlorothiazide (PRINZIDE,ZESTORETIC) 20-25 MG tablet TAKE 1 TABLET BY MOUTH EVERY DAY FOR BLOOD PRESSURE 01/14/19  Yes [provider]  diclofenac sodium (VOLTAREN) 1 % GEL Apply 4 g topically 4 (four) times daily. 02/25/19   Marcial Pacas, MD  gabapentin (NEURONTIN) 300 MG capsule TAKE 2 CAPSULES BY MOUTH 3 TIMES DAILY Patient not taking: Reported on 09/30/2019 03/19/19   Marcial Pacas, MD  lidocaine-prilocaine (EMLA) cream Apply 1 application topically as needed. 02/25/19   Marcial Pacas, MD  XIIDRA 5 % SOLN INSTILL 1 DROP INTO EACH EYE TWICE A DAY 12/16/18   [provider]    Current Outpatient Medications  Medication Sig Dispense  Refill   amitriptyline (ELAVIL) 25 MG tablet TAKE 1/2 TO 1 TABLET BY MOUTH EVERY DAY AT BEDTIME     atorvastatin (LIPITOR) 40 MG tablet Take 40 mg by mouth daily.     estradiol (VIVELLE-DOT) 0.0375 MG/24HR APPLY 1 PATCH TWICE A WEEK     lisinopril-hydrochlorothiazide (PRINZIDE,ZESTORETIC) 20-25 MG tablet TAKE 1 TABLET BY MOUTH EVERY DAY FOR BLOOD PRESSURE     diclofenac sodium (VOLTAREN) 1 % GEL Apply 4 g topically 4 (four) times daily. 100 g 6   gabapentin (NEURONTIN) 300 MG capsule TAKE 2 CAPSULES BY MOUTH 3 TIMES DAILY (Patient not taking: Reported on 09/30/2019) 540 capsule 1   lidocaine-prilocaine (EMLA) cream Apply 1 application topically as needed. 30 g 3   XIIDRA 5 % SOLN INSTILL 1 DROP INTO EACH EYE TWICE A DAY     Current Facility-Administered Medications  Medication Dose Route Frequency Provider Last Rate Last Admin   0.9 %  sodium chloride infusion  500 mL Intravenous Once Jackquline Denmark, MD        Allergies as of 12/04/2022   (No Known Allergies)    Family History  Problem Relation Age of Onset   Hypertension Mother    Hypercholesterolemia Mother    Colon polyps Father    Hypercholesterolemia Father    Colon cancer Father 53   Breast cancer Sister  Colon polyps Sister    Heart murmur Sister    Von Hippel-Lindau syndrome Brother    Colon polyps Brother    Arrhythmia Brother    Hypothyroidism Brother    Other Maternal Grandmother        MVA, subdural hematoma   Heart attack Maternal Grandfather    Dementia Paternal Grandmother    Lung cancer Paternal Grandfather    Esophageal cancer Neg Hx    Rectal cancer Neg Hx    Stomach cancer Neg Hx     Social History   Socioeconomic History   Marital status: Married    Spouse name: Kristen Odonnell   Number of children: 1   Years of education: Not on file   Highest education level: High school graduate  Occupational History   Occupation: clerical work/accounting    Comment: [employer]  Tobacco Use   Smoking status:  Former   Smokeless tobacco: Never  Vaping Use   Vaping Use: Never used  Substance and Sexual Activity   Alcohol use: Yes    Alcohol/week: 2.0 standard drinks of alcohol    Types: 2 Cans of beer per week   Drug use: Never   Sexual activity: Not on file  Other Topics Concern   Not on file  Social History Narrative   Lives with husband.   Right-handed.   Caffeine use: 1 cup of coffee daily   Social Determinants of Health   Financial Resource Strain: Not on file  Food Insecurity: Not on file  Transportation Needs: Not on file  Physical Activity: Not on file  Stress: Not on file  Social Connections: Not on file  Intimate Partner Violence: Not on file    Review of Systems: Positive for none All other review of systems negative except as mentioned in the HPI.  Physical Exam: Vital signs in last 24 hours: '@VSRANGES'$ @   General:   Alert,  Well-developed, well-nourished, pleasant and cooperative in NAD Lungs:  Clear throughout to auscultation.   Heart:  Regular rate and rhythm; no murmurs, clicks, rubs,  or gallops. Abdomen:  Soft, nontender and nondistended. Normal bowel sounds.   Neuro/Psych:  Alert and cooperative. Normal mood and affect. A and O x 3    No significant changes were identified.  The patient continues to be an appropriate candidate for the planned procedure and anesthesia.   Carmell Austria, MD. [facility] 12/04/2022 8:31 AM@

## 2022-12-04 NOTE — Patient Instructions (Signed)
   HANDOUTS ON POLYPS & HEMORRHOIDS GIVEN TO YOU TODAY  AWAIT PATHOLOGY RESULTS on polyp removed     YOU HAD AN ENDOSCOPIC PROCEDURE TODAY AT [facility]:   Refer to the procedure report that was given to you for any specific questions about what was found during the examination.  If the procedure report does not answer your questions, please call your gastroenterologist to clarify.  If you requested that your care partner not be given the details of your procedure findings, then the procedure report has been included in a sealed envelope for you to review at your convenience later.  YOU SHOULD EXPECT: Some feelings of bloating in the abdomen. Passage of more gas than usual.  Walking can help get rid of the air that was put into your GI tract during the procedure and reduce the bloating. If you had a lower endoscopy (such as a colonoscopy or flexible sigmoidoscopy) you may notice spotting of blood in your stool or on the toilet paper. If you underwent a bowel prep for your procedure, you may not have a normal bowel movement for a few days.  Please Note:  You might notice some irritation and congestion in your nose or some drainage.  This is from the oxygen used during your procedure.  There is no need for concern and it should clear up in a day or so.  SYMPTOMS TO REPORT IMMEDIATELY:  Following lower endoscopy (colonoscopy or flexible sigmoidoscopy):  Excessive amounts of blood in the stool  Significant tenderness or worsening of abdominal pains  Swelling of the abdomen that is new, acute  Fever of 100F or higher  For urgent or emergent issues, a gastroenterologist can be reached at any hour by calling [phone]. Do not use MyChart messaging for urgent concerns.    DIET:  We do recommend a small meal at first, but then you may proceed to your regular diet.  Drink plenty of fluids but you should avoid alcoholic beverages for 24 hours.  ACTIVITY:  You should plan to  take it easy for the rest of today and you should NOT DRIVE or use heavy machinery until tomorrow (because of the sedation medicines used during the test).    FOLLOW UP: Our staff will call the number listed on your records the next business day following your procedure.  We will call around 7:15- 8:00 am to check on you and address any questions or concerns that you may have regarding the information given to you following your procedure. If we do not reach you, we will leave a message.     If any biopsies were taken you will be contacted by phone or by letter within the next 1-3 weeks.  Please call us at [phone] if you have not heard about the biopsies in 3 weeks.    SIGNATURES/CONFIDENTIALITY: You and/or your care partner have signed paperwork which will be entered into your electronic medical record.  These signatures attest to the fact that that the information above on your After Visit Summary has been reviewed and is understood.  Full responsibility of the confidentiality of this discharge information lies with you and/or your care-partner.

## 2022-12-04 NOTE — Progress Notes (Signed)
Called to room to assist during endoscopic procedure.  Patient ID and intended procedure confirmed with present staff. Received instructions for my participation in the procedure from the performing physician.  

## 2022-12-05 ENCOUNTER — Telehealth: Payer: Self-pay | Admitting: *Deleted

## 2022-12-05 NOTE — Telephone Encounter (Signed)
  Follow up Call-     12/04/2022    7:31 AM  Call back number  Post procedure Call Back phone  # [phone]  Permission to leave phone message Yes     Patient questions:  Do you have a fever, pain , or abdominal swelling? No. Pain Score  0 *  Have you tolerated food without any problems? Yes.    Have you been able to return to your normal activities? Yes.    Do you have any questions about your discharge instructions: Diet   No. Medications  No. Follow up visit  No.  Do you have questions or concerns about your Care? No.  Actions: * If pain score is 4 or above: No action needed, pain <4.

## 2022-12-12 ENCOUNTER — Encounter: Payer: Self-pay | Admitting: Gastroenterology

## 2023-01-31 ENCOUNTER — Emergency Department (HOSPITAL_COMMUNITY)
Admission: EM | Admit: 2023-01-31 | Discharge: 2023-01-31 | Disposition: A | Discharge status: Home / Self Care | Payer: Commercial Managed Care - PPO | Attending: Emergency Medicine | Admitting: Emergency Medicine

## 2023-01-31 ENCOUNTER — Emergency Department (HOSPITAL_COMMUNITY): Payer: Commercial Managed Care - PPO

## 2023-01-31 DIAGNOSIS — I10 Essential (primary) hypertension: Secondary | ICD-10-CM | POA: Insufficient documentation

## 2023-01-31 DIAGNOSIS — R079 Chest pain, unspecified: Secondary | ICD-10-CM | POA: Insufficient documentation

## 2023-01-31 LAB — COMPREHENSIVE METABOLIC PANEL
ALT: 30 U/L (ref 0–44)
AST: 28 U/L (ref 15–41)
Albumin: 4 g/dL (ref 3.5–5.0)
Alkaline Phosphatase: 44 U/L (ref 38–126)
Anion gap: 13 (ref 5–15)
BUN: 6 mg/dL — ABNORMAL LOW (ref 8–23)
CO2: 24 mmol/L (ref 22–32)
Calcium: 9.4 mg/dL (ref 8.9–10.3)
Chloride: 96 mmol/L — ABNORMAL LOW (ref 98–111)
Creatinine, Ser: 0.63 mg/dL (ref 0.44–1.00)
GFR, Estimated: >60 mL/min (ref >60)
Glucose, Bld: 102 mg/dL — ABNORMAL HIGH (ref 70–99)
Potassium: 3.8 mmol/L (ref 3.5–5.1)
Sodium: 133 mmol/L — ABNORMAL LOW (ref 135–145)
Total Bilirubin: 0.7 mg/dL (ref 0.3–1.2)
Total Protein: 7 g/dL (ref 6.5–8.1)

## 2023-01-31 LAB — CBC WITH DIFFERENTIAL/PLATELET
Abs Immature Granulocytes: 0.01 10*3/uL (ref 0.00–0.07)
Basophils Absolute: 0 10*3/uL (ref 0.0–0.1)
Basophils Relative: 1 %
Eosinophils Absolute: 0.2 10*3/uL (ref 0.0–0.5)
Eosinophils Relative: 4 %
HCT: 38.8 % (ref 36.0–46.0)
Hemoglobin: 13.4 g/dL (ref 12.0–15.0)
Immature Granulocytes: 0 %
Lymphocytes Relative: 30 %
Lymphs Abs: 1.4 10*3/uL (ref 0.7–4.0)
MCH: 33.9 pg (ref 26.0–34.0)
MCHC: 34.5 g/dL (ref 30.0–36.0)
MCV: 98.2 fL (ref 80.0–100.0)
Monocytes Absolute: 0.3 10*3/uL (ref 0.1–1.0)
Monocytes Relative: 6 %
Neutro Abs: 2.8 10*3/uL (ref 1.7–7.7)
Neutrophils Relative %: 59 %
Platelets: 284 10*3/uL (ref 150–400)
RBC: 3.95 MIL/uL (ref 3.87–5.11)
RDW: 11.8 % (ref 11.5–15.5)
WBC: 4.7 10*3/uL (ref 4.0–10.5)
nRBC: 0 % (ref 0.0–0.2)

## 2023-01-31 LAB — TROPONIN I (HIGH SENSITIVITY)
Troponin I (High Sensitivity): <2 ng/L (ref <18)
Troponin I (High Sensitivity): <2 ng/L (ref <18)

## 2023-01-31 LAB — BRAIN NATRIURETIC PEPTIDE: B Natriuretic Peptide: 32.4 pg/mL (ref 0.0–100.0)

## 2023-01-31 LAB — LIPASE, BLOOD: Lipase: 51 U/L (ref 11–51)

## 2023-01-31 MED ORDER — OMEPRAZOLE 20 MG PO CPDR: 20.0000 mg | DELAYED_RELEASE_CAPSULE | Freq: Every day | ORAL | 0 refills | Status: AC

## 2023-01-31 NOTE — ED Notes (Signed)
Pt ambulated to restroom with steady gait.

## 2023-01-31 NOTE — ED Provider Notes (Signed)
[facility] Provider Note   CSN: [id number] Arrival date & time: 01/31/23  [id number]     History Chief Complaint  Patient presents with   Chest Pain    HPI Kristen Odonnell is a 62 y.o. female presenting for chief complaint of chest pain.  She is a 62 year old female with a history of hypertension and a family history of heart disease who presents with approximately 12 hours of left-sided discomfort.  She states it radiates to her shoulder radiates around to her side and has been intermittent in nature.  It is currently improving.  She denies fevers or chills, nausea vomiting, syncope shortness of breath..   Patient's recorded medical, surgical, social, medication list and allergies were reviewed in the Snapshot window as part of the initial history.   Review of Systems   Review of Systems  Constitutional:  Negative for chills and fever.  HENT:  Negative for ear pain and sore throat.   Eyes:  Negative for pain and visual disturbance.  Respiratory:  Negative for cough and shortness of breath.   Cardiovascular:  Positive for chest pain. Negative for palpitations.  Gastrointestinal:  Negative for abdominal pain and vomiting.  Genitourinary:  Negative for dysuria and hematuria.  Musculoskeletal:  Negative for arthralgias and back pain.  Skin:  Negative for color change and rash.  Neurological:  Negative for seizures and syncope.  All other systems reviewed and are negative.   Physical Exam Updated Vital Signs BP 137/84   Pulse 69   Temp 97.9 F (36.6 C)   Resp 14   SpO2 99%  Physical Exam Vitals and nursing note reviewed.  Constitutional:      General: She is not in acute distress.    Appearance: She is well-developed.  HENT:     Head: Normocephalic and atraumatic.  Eyes:     Conjunctiva/sclera: Conjunctivae normal.  Cardiovascular:     Rate and Rhythm: Normal rate and regular rhythm.     Heart sounds: No murmur heard. Pulmonary:      Effort: Pulmonary effort is normal. No respiratory distress.     Breath sounds: Normal breath sounds.  Abdominal:     General: There is no distension.     Palpations: Abdomen is soft.     Tenderness: There is no abdominal tenderness. There is no right CVA tenderness or left CVA tenderness.  Musculoskeletal:        General: No swelling or tenderness. Normal range of motion.     Cervical back: Neck supple.  Skin:    General: Skin is warm and dry.  Neurological:     General: No focal deficit present.     Mental Status: She is alert and oriented to person, place, and time. Mental status is at baseline.     Cranial Nerves: No cranial nerve deficit.      ED Course/ Medical Decision Making/ A&P    Procedures Procedures   Medications Ordered in ED Medications - No data to display Medical Decision Making: Kristen Odonnell is a 61 y.o. female who presented to the ED today with chest pain, detailed above.  Based on patient's comorbidities, patient has a heart score of 3.    Patient's presentation is complicated by their history of multiple comorbid medical problems.  Patient placed on continuous vitals and telemetry monitoring while in ED which was reviewed periodically.  Complete initial physical exam performed, notably the patient was hemodynamically stable in no acute distress.  Reviewed and confirmed nursing documentation for past medical history, family history, social history.    Initial Assessment: With the patient's presentation of left-sided chest pain, most likely diagnosis is musculoskeletal chest pain versus GERD, although ACS remains on the differential. Other diagnoses were considered including (but not limited to) pulmonary embolism, community-acquired pneumonia, aortic dissection, pneumothorax, underlying bony abnormality, anemia. These are considered less likely due to history of present illness and physical exam findings.    In particular, concerning pulmonary embolism:  Patient is denying malignancy, recent surgery, history of DVT, or calf tenderness leading to a low risk Wells score. Aortic Dissection also reconsidered but seems less likely based on the location, quality, onset, and severity of symptoms in this case. Patient also has a lack of underlying history of AD or TAA.  This is most consistent with an acute life/limb threatening illness complicated by underlying chronic conditions.   Initial Plan: Evaluate for ACS with delta troponin and EKG evaluated as below  Evaluate for dissection, bony abnormality, or pneumonia with chest x-ray and screening laboratory evaluation including CBC, BMP  Further evaluation for pulmonary embolism not indicated at this time based on patient's Wells score.  Further evaluation for Thoracic Aortic Dissection not indicated at this time based on patient's clinical history and PE findings.   Initial Study Results: EKG was reviewed independently. Rate, rhythm, axis, intervals all examined and without medically relevant abnormality. ST segments without concerns for elevations.    Laboratory  Delta troponin demonstrated no acute abnormalities  CBC and BMP without obvious metabolic or inflammatory abnormalities requiring further evaluation   Radiology  DG Chest Portable 1 View  Result Date: 01/31/2023 CLINICAL DATA:  Shortness of breath and chest pain. EXAM: PORTABLE CHEST 1 VIEW COMPARISON:  05/24/2011 FINDINGS: The lungs are clear without focal pneumonia, edema, pneumothorax or pleural effusion. The cardiopericardial silhouette is within normal limits for size. The visualized bony structures of the thorax are unremarkable. Telemetry leads overlie the chest. IMPRESSION: No active disease. Electronically Signed   By: Misty Stanley M.D.   On: 01/31/2023 09:49    Final Assessment and Plan: Reassessed patient at bedside.  Symptoms remain resolved at this time.  Had a prolonged conversation with patient regarding potential etiology.   Less likely be pulmonary or cardiac given negative troponin, reassuring x-ray, gross improvement prior to intervention.  More likely to be musculoskeletal such as costochondritis or gastroesophageal in origin.  Will trial therapy with Tylenol 650 3 times daily and omeprazole 20 mg daily for 14 days with plan to follow-up with PCP in the interim.  Additionally, given family history of cardiac disease early in life, will refer patient to cardiology for close outpatient care and management. Disposition:  I have considered need for hospitalization, however, considering all of the above, I believe this patient is stable for discharge at this time.  Patient/family educated about specific return precautions for given chief complaint and symptoms.  Patient/family educated about follow-up with PCP.     Patient/family expressed understanding of return precautions and need for follow-up. Patient spoken to regarding all imaging and laboratory results and appropriate follow up for these results. All education provided in verbal form with additional information in written form. Time was allowed for answering of patient questions. Patient discharged.    Emergency Department Medication Summary:   Medications - No data to display     Clinical Impression:  1. Chest pain, unspecified type      Discharge   Final Clinical Impression(s) /  ED Diagnoses Final diagnoses:  Chest pain, unspecified type    Rx / DC Orders ED Discharge Orders          Ordered    omeprazole (PRILOSEC) 20 MG capsule  Daily        01/31/23 1315              Tretha Sciara, MD 01/31/23 1316
